# Patient Record
Sex: Female | Born: 1970 | Race: Black or African American | Hispanic: No | Marital: Married | Smoking: Never smoker
Health system: Southern US, Community
[De-identification: ages and names within clinical notes are randomized; demographics above are authoritative.]

## PROBLEM LIST (undated history)

## (undated) DIAGNOSIS — J45909 Unspecified asthma, uncomplicated: Secondary | ICD-10-CM

---

## 2018-06-03 ENCOUNTER — Emergency Department (HOSPITAL_COMMUNITY)
Admission: EM | Admit: 2018-06-03 | Discharge: 2018-06-03 | Disposition: A | Discharge status: Home / Self Care | Payer: Managed Care, Other (non HMO) | Attending: Emergency Medicine | Admitting: Emergency Medicine

## 2018-06-03 ENCOUNTER — Encounter (HOSPITAL_COMMUNITY): Payer: Self-pay

## 2018-06-03 ENCOUNTER — Emergency Department (HOSPITAL_COMMUNITY): Payer: Managed Care, Other (non HMO)

## 2018-06-03 DIAGNOSIS — M5412 Radiculopathy, cervical region: Secondary | ICD-10-CM

## 2018-06-03 DIAGNOSIS — J45909 Unspecified asthma, uncomplicated: Secondary | ICD-10-CM | POA: Diagnosis not present

## 2018-06-03 DIAGNOSIS — Z79899 Other long term (current) drug therapy: Secondary | ICD-10-CM | POA: Insufficient documentation

## 2018-06-03 DIAGNOSIS — M79601 Pain in right arm: Secondary | ICD-10-CM | POA: Diagnosis present

## 2018-06-03 HISTORY — DX: Unspecified asthma, uncomplicated: J45.909

## 2018-06-03 MED ORDER — PREDNISONE 10 MG PO TABS: 20.0000 mg | ORAL_TABLET | Freq: Every day | ORAL | 0 refills | Status: AC

## 2018-06-03 MED ORDER — HYDROCODONE-ACETAMINOPHEN 5-325 MG PO TABS
1.0000 | ORAL_TABLET | Freq: Once | ORAL | Status: AC
  Administered 2018-06-03: 1 via ORAL
  Filled 2018-06-03: qty 1

## 2018-06-03 MED ORDER — ONDANSETRON 4 MG PO TBDP
4.0000 mg | ORAL_TABLET | Freq: Once | ORAL | Status: AC
  Administered 2018-06-03: 4 mg via ORAL
  Filled 2018-06-03: qty 1

## 2018-06-03 MED ORDER — HYDROCODONE-ACETAMINOPHEN 5-325 MG PO TABS: 1.0000 | ORAL_TABLET | Freq: Four times a day (QID) | ORAL | 0 refills | Status: AC | PRN

## 2018-06-03 NOTE — ED Triage Notes (Signed)
Patient has been sleeping on her side. Patient c/o of right upper back/neck pain x3days and now radiating down her right armx1 day. Patient is in severe pain when she tries to get up and has been laying in her bed for 36 hours.

## 2018-06-03 NOTE — ED Provider Notes (Signed)
[facility]-EMERGENCY DEPT Provider Note   CSN: [id number] Arrival date & time: 06/03/18  1034     History   Chief Complaint Chief Complaint  Patient presents with  . Arm Pain  . Back Pain    HPI Cassie Long is a 47 y.o. female.  Patient complains of pain numbness down her right arm mild weakness  The history is provided by the patient. No language interpreter was used.  Arm Pain  This is a new problem. The current episode started more than 2 days ago. The problem occurs constantly. The problem has not changed since onset.Pertinent negatives include no chest pain, no abdominal pain and no headaches. Exacerbated by: Movement. Nothing relieves the symptoms. She has tried nothing for the symptoms. The treatment provided no relief.    Past Medical History:  Diagnosis Date  . Asthma     There are no active problems to display for this patient.   History reviewed. No pertinent surgical history.   OB History   None      Home Medications    Prior to Admission medications   Medication Sig Start Date End Date Taking? Authorizing Provider  naproxen sodium (ALEVE) 220 MG tablet Take 660 mg by mouth 2 (two) times daily as needed (pain).   Yes [provider]  HYDROcodone-acetaminophen (NORCO/VICODIN) 5-325 MG tablet Take 1 tablet by mouth every 6 (six) hours as needed for moderate pain. 06/03/18   Bethann BerkshireZammit, Hortencia Martire, MD  predniSONE (DELTASONE) 10 MG tablet Take 2 tablets (20 mg total) by mouth daily. 06/03/18   Bethann BerkshireZammit, Necola Bluestein, MD    Family History History reviewed. No pertinent family history.  Social History Social History   Tobacco Use  . Smoking status: Never Smoker  . Smokeless tobacco: Never Used  Substance Use Topics  . Alcohol use: Never    Frequency: Never  . Drug use: Never     Allergies   Patient has no known allergies.   Review of Systems Review of Systems  Constitutional: Negative for appetite change and fatigue.    HENT: Negative for congestion, ear discharge and sinus pressure.   Eyes: Negative for discharge.  Respiratory: Negative for cough.   Cardiovascular: Negative for chest pain.  Gastrointestinal: Negative for abdominal pain and diarrhea.  Genitourinary: Negative for frequency and hematuria.  Musculoskeletal: Negative for back pain.  Skin: Negative for rash.  Neurological: Negative for seizures and headaches.       Weakness in right arm with numbness  Psychiatric/Behavioral: Negative for hallucinations.     Physical Exam Updated Vital Signs BP 124/60 (BP Location: Left Arm)   Pulse 67   Temp 97.9 F (36.6 C) (Oral)   Resp 18   LMP 05/13/2018 (Approximate)   SpO2 100%   Physical Exam  Constitutional: She is oriented to person, place, and time. She appears well-developed.  HENT:  Head: Normocephalic.  Eyes: Conjunctivae and EOM are normal. No scleral icterus.  Neck: Neck supple. No thyromegaly present.  Cardiovascular: Normal rate and regular rhythm. Exam reveals no gallop and no friction rub.  No murmur heard. Pulmonary/Chest: No stridor. She has no wheezes. She has no rales. She exhibits no tenderness.  Abdominal: She exhibits no distension. There is no tenderness. There is no rebound.  Musculoskeletal: Normal range of motion. She exhibits no edema.  Decreased strength in right arm, tenderness to upper right trapezius muscle  Lymphadenopathy:    She has no cervical adenopathy.  Neurological: She is oriented to person, place,  and time. She exhibits normal muscle tone. Coordination normal.  Skin: No rash noted. No erythema.  Psychiatric: She has a normal mood and affect. Her behavior is normal.     ED Treatments / Results  Labs (all labs ordered are listed, but only abnormal results are displayed) Labs Reviewed - No data to display  EKG None  Radiology Dg Chest 2 View  Result Date: 06/03/2018 CLINICAL DATA:  Neck and right sided pain EXAM: CHEST - 2 VIEW COMPARISON:   None. FINDINGS: Lungs are clear. Heart size and pulmonary vascularity are normal. No adenopathy. No pneumothorax. No bone lesions. IMPRESSION: No edema or consolidation. Electronically Signed   By: Bretta Bang III M.D.   On: 06/03/2018 13:48   Dg Cervical Spine Complete  Result Date: 06/03/2018 CLINICAL DATA:  47 year old female with right posterolateral neck pain radiating into the right arm for the past 3 days EXAM: CERVICAL SPINE - COMPLETE 4+ VIEW COMPARISON:  Concurrently obtained chest x-ray FINDINGS: There is no evidence of cervical spine fracture or prevertebral soft tissue swelling. Slight reversal of the normal cervical lordosis centered at C6. No evidence of bony foraminal stenosis. No other significant bone abnormalities are identified. IMPRESSION: Straightening of the normal cervical lordosis may be reflective of underlying muscular spasm. No evidence of fracture, malalignment, osseous lesion or significant degenerative change. Electronically Signed   By: Malachy Moan M.D.   On: 06/03/2018 13:48    Procedures Procedures (including critical care time)  Medications Ordered in ED Medications  HYDROcodone-acetaminophen (NORCO/VICODIN) 5-325 MG per tablet 1 tablet (1 tablet Oral Given 06/03/18 1218)  ondansetron (ZOFRAN-ODT) disintegrating tablet 4 mg (4 mg Oral Given 06/03/18 1219)     Initial Impression / Assessment and Plan / ED Course  I have reviewed the triage vital signs and the nursing notes.  Pertinent labs & imaging results that were available during my care of the patient were reviewed by me and considered in my medical decision making (see chart for details). Chest x-ray and cervical spine series unremarkable.  Suspect cervical neuritis.  Patient will be given pain medicine and prednisone and told to follow-up with a family doctor or neurosurgery if she cannot get into see a primary care doctor     Final Clinical Impressions(s) / ED Diagnoses   Final  diagnoses:  Cervical neuritis    ED Discharge Orders        Ordered    predniSONE (DELTASONE) 10 MG tablet  Daily     06/03/18 1409    HYDROcodone-acetaminophen (NORCO/VICODIN) 5-325 MG tablet  Every 6 hours PRN     06/03/18 1409       Bethann Berkshire, MD 06/03/18 1414

## 2018-06-03 NOTE — Discharge Instructions (Addendum)
Follow-up with a family doctor or you can follow-up with neurosurgery Dr. Dutch QuintPoole or 1 of his partners if you are not improving in the next 1 to 2 weeks

## 2018-07-13 ENCOUNTER — Encounter: Payer: Self-pay | Admitting: Neurology

## 2018-10-14 ENCOUNTER — Ambulatory Visit: Payer: Managed Care, Other (non HMO) | Admitting: Neurology

## 2018-10-14 ENCOUNTER — Encounter

## 2019-02-18 IMAGING — DX DG CERVICAL SPINE COMPLETE 4+V
7 series · 7 of 7 positions shown · non-contrast
Comparison: Concurrently obtained chest x-ray

CLINICAL DATA: 47-year-old female with right posterolateral neck
pain radiating into the right arm for the past 3 days

EXAM:
CERVICAL SPINE - COMPLETE 4+ VIEW

[c-spine lat]
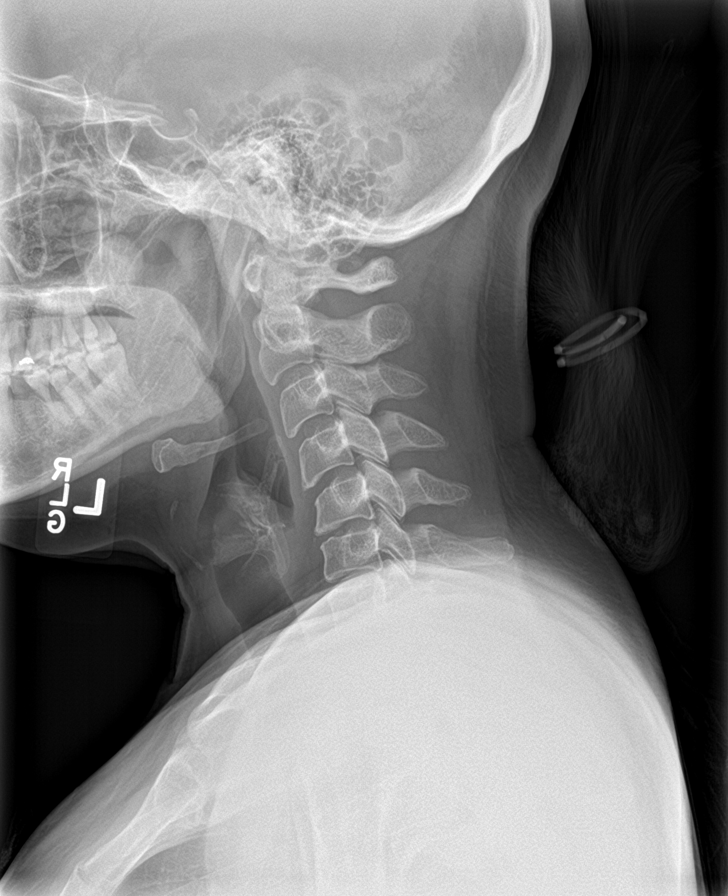

[c-spine obl (1 of 2)]
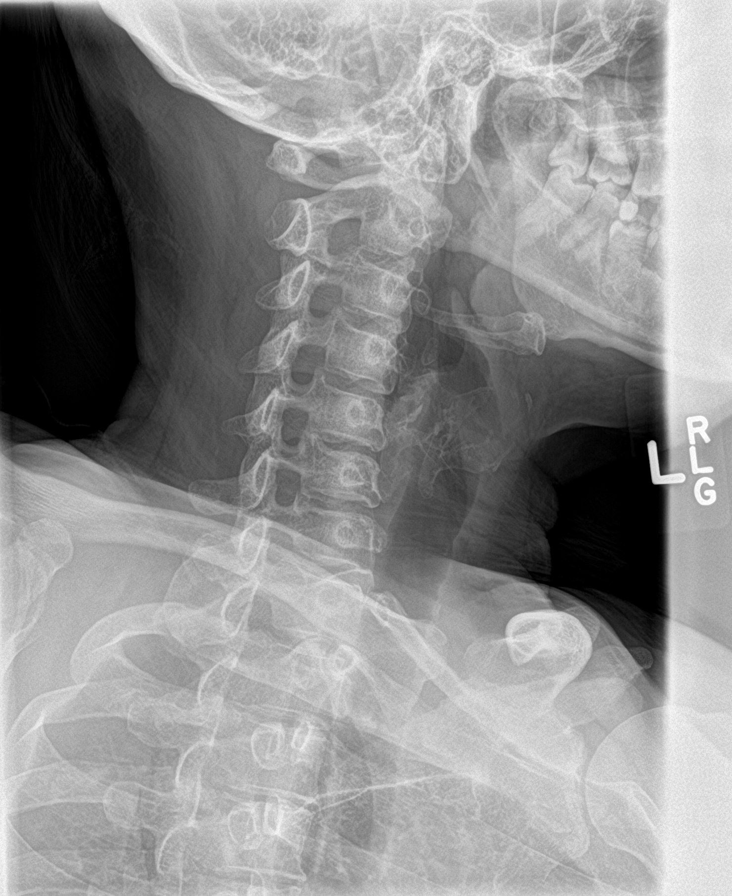

[c-spine obl (2 of 2)]
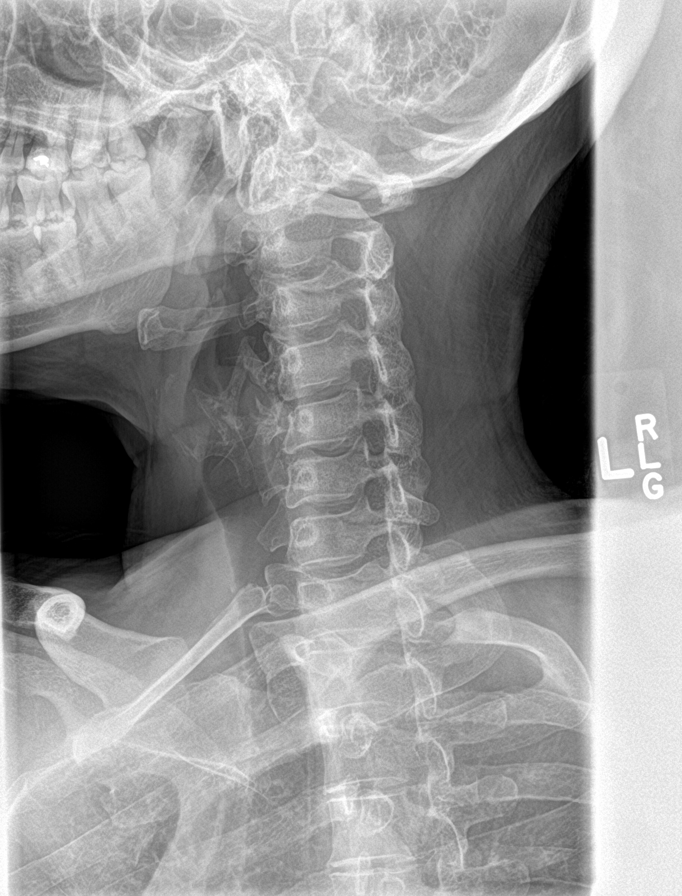

[c-spine ap]
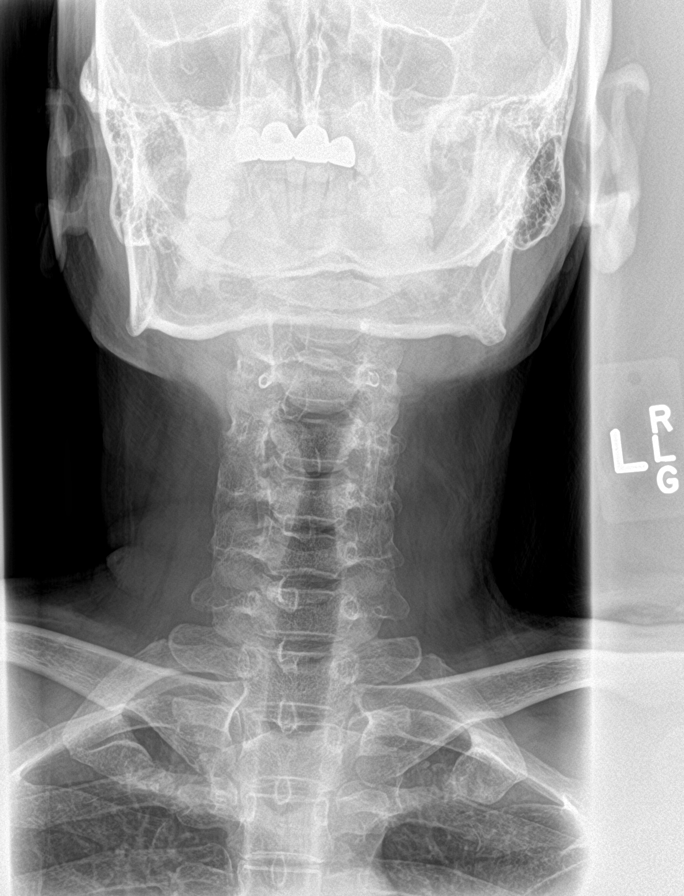

[c-spine open mouth]
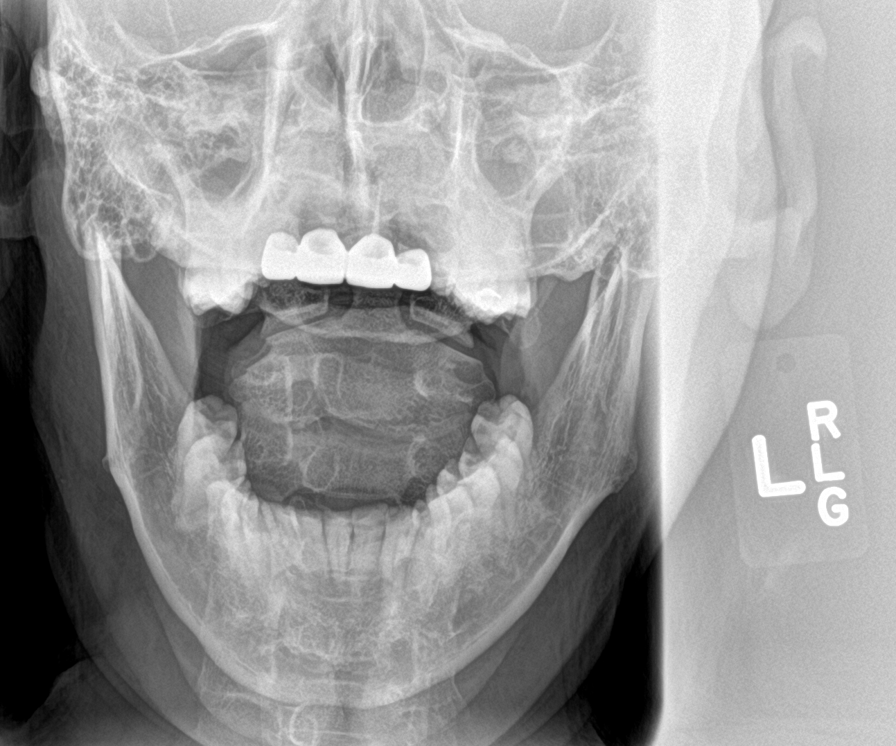

[c-spine swimmers]
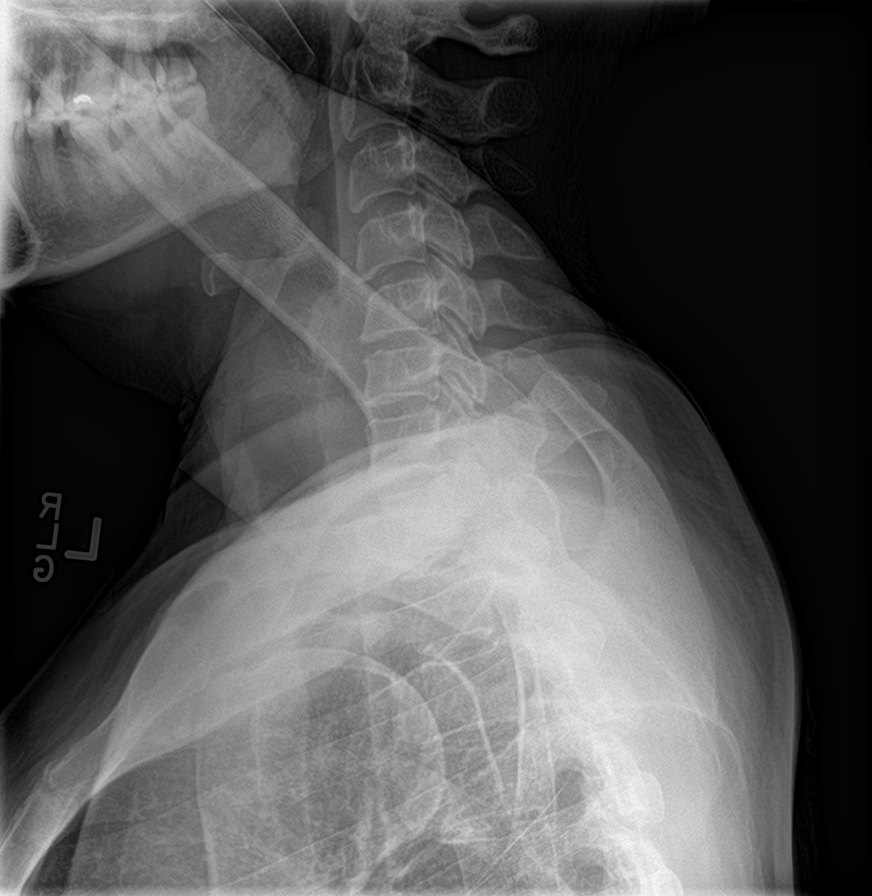

[[person_name]]
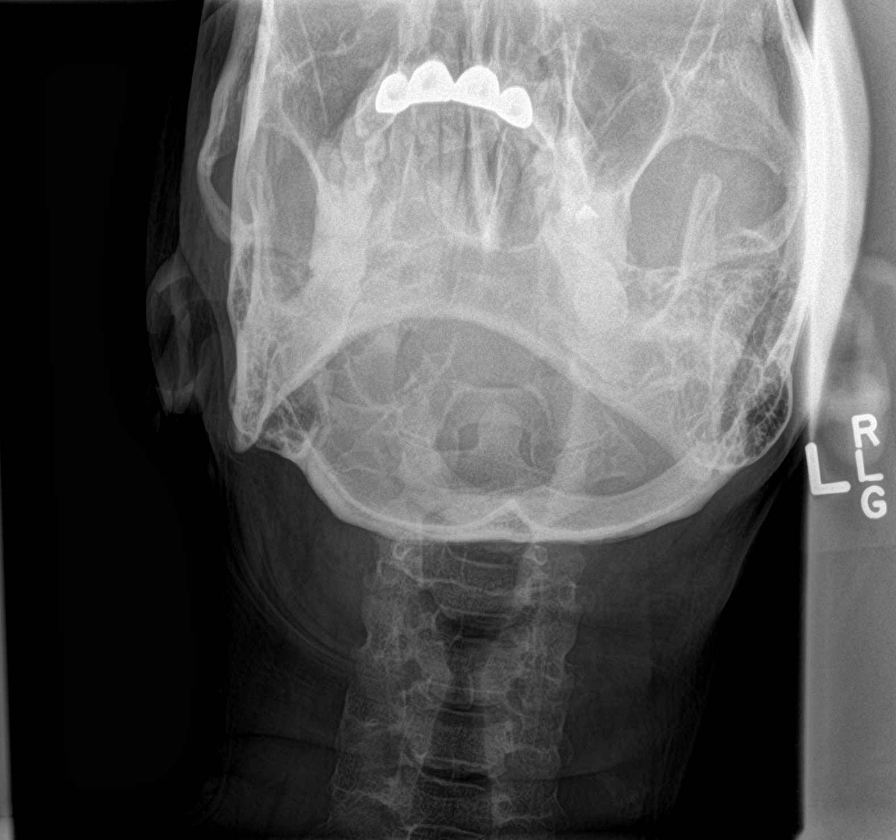

[7 of 7 positions shown; findings below may reference images not displayed]

FINDINGS: There is no evidence of cervical spine fracture or prevertebral soft
tissue swelling. Slight reversal of the normal cervical lordosis
centered at C6. No evidence of bony foraminal stenosis. No other
significant bone abnormalities are identified.
IMPRESSION: Straightening of the normal cervical lordosis may be reflective of
underlying muscular spasm.

No evidence of fracture, malalignment, osseous lesion or significant
degenerative change.

## 2020-02-01 ENCOUNTER — Ambulatory Visit: Payer: Managed Care, Other (non HMO) | Admitting: Physician Assistant

## 2020-02-01 ENCOUNTER — Encounter: Payer: Self-pay | Admitting: Physician Assistant

## 2020-02-01 ENCOUNTER — Other Ambulatory Visit: Payer: Self-pay

## 2020-02-01 DIAGNOSIS — L409 Psoriasis, unspecified: Secondary | ICD-10-CM

## 2020-02-01 MED ORDER — CLOBETASOL PROPIONATE 0.05 % EX SOLN: 1.0000 "application " | Freq: Two times a day (BID) | CUTANEOUS | 0 refills | Status: AC

## 2020-02-01 MED ORDER — CLOBETASOL PROPIONATE 0.05 % EX OINT: 1.0000 "application " | TOPICAL_OINTMENT | Freq: Two times a day (BID) | CUTANEOUS | 0 refills | Status: AC

## 2020-02-01 MED ORDER — TRIAMCINOLONE ACETONIDE 10 MG/ML IJ SUSP: 2.5000 mg | Freq: Once | INTRAMUSCULAR | Status: AC

## 2020-02-01 NOTE — Progress Notes (Signed)
   Follow up Visit  Subjective  Cassie Long is a 49 y.o. female who presents for the following: Psoriasis (here for injection of ILTAC in elbow ) and Medication Refill (needs clobetasol ointment and cobetasol solution.). Taclonex is very expensive and Duobrii works well but was $50. Right arm is clear and left has a plaque. Scalp comes and goes but topicals seem to control it. Scalp itches and she is very self conscious about how the plaques look. The right elbow was injected last visit in 2020 and has never reflared.    Objective  Well appearing patient in no apparent distress; mood and affect are within normal limits.  A focused examination was performed including elbows and scalp. Relevant physical exam findings are noted in the Assessment and Plan.   Objective  Left Elbow - Posterior, Right Elbow - Posterior, Scalp: Well-marginated erythematous papules/plaques with silvery scale.   Assessment & Plan  Psoriasis (3) Left Elbow - Posterior; Right Elbow - Posterior; Scalp  Intralesional injection - Left Elbow - Posterior, Scalp Location: left elbow and occipital scalp.  Informed Consent: Discussed risks (infection, pain, bleeding, bruising, thinning of the skin, loss of skin pigment,  Indentation, lack of resolution, and recurrence of lesion) and benefits of the procedure, as well as the alternatives. Informed consent was obtained. Preparation: The area was prepared in a standard fashion.   Procedure Details: An intralesional injection was performed with Kenalog 5 mg/cc. 0.5 cc in total were injected.  Total number of injections: 8. 3-elbow 5-scalp  Plan: The patient was instructed on post-op care. Recommend OTC analgesia as needed for pain.   triamcinolone acetonide (KENALOG) 10 MG/ML injection 2.5 mg - Left Elbow - Posterior, Scalp

## 2021-05-14 ENCOUNTER — Ambulatory Visit: Payer: Managed Care, Other (non HMO) | Admitting: Physician Assistant

## 2021-05-27 ENCOUNTER — Ambulatory Visit: Payer: Managed Care, Other (non HMO) | Admitting: Physician Assistant
